# Patient Record
Sex: Female | Born: 1935 | Race: White | Hispanic: No
Health system: Southern US, Community
[De-identification: ages and names within clinical notes are randomized; demographics above are authoritative.]

## PROBLEM LIST (undated history)

## (undated) DIAGNOSIS — F0390 Unspecified dementia without behavioral disturbance: Secondary | ICD-10-CM

## (undated) DIAGNOSIS — I639 Cerebral infarction, unspecified: Secondary | ICD-10-CM

## (undated) HISTORY — PX: TONSILLECTOMY: SUR1361

## (undated) HISTORY — PX: BACK SURGERY: SHX140

## (undated) HISTORY — PX: ABDOMINAL HYSTERECTOMY: SHX81

---

## 2009-07-28 ENCOUNTER — Emergency Department (HOSPITAL_BASED_OUTPATIENT_CLINIC_OR_DEPARTMENT_OTHER): Admission: EM | Admit: 2009-07-28 | Discharge: 2009-07-28 | Payer: Self-pay | Admitting: Emergency Medicine

## 2009-07-28 ENCOUNTER — Ambulatory Visit: Payer: Self-pay | Admitting: Diagnostic Radiology

## 2010-08-07 ENCOUNTER — Emergency Department (HOSPITAL_BASED_OUTPATIENT_CLINIC_OR_DEPARTMENT_OTHER): Admission: EM | Admit: 2010-08-07 | Discharge: 2010-08-07 | Payer: Self-pay | Admitting: Emergency Medicine

## 2010-11-02 IMAGING — CT CT PELVIS W/ CM
2 of 4 series · 16 of 46 positions shown, 18 images · IV contrast (APPLIED)
Comparison: None

CT HEAD

CLINICAL DATA: Fall with head injury and headache, left abdominal
pelvic pain.

CT HEAD WITHOUT CONTRAST
CT ABDOMEN AND PELVIS WITH CONTRAST
TECHNIQUE: Multidetector CT imaging of the head was performed
following the standard protocol without intravenous contrast.
Multidetector CT imaging of the abdomen and pelvis was performed
following the standard protocol during bolus administration of
intravenous contrast.
Contrast: 100 ml intravenous Emnipaque-PVV

[Series 2: abd/pelvis 5.0 b31f · axial · 0.65mm/px · z∈[+823,+1168]mm · 13 of 77 slices shown, 15 images]
[im 4/77  soft-tissue]
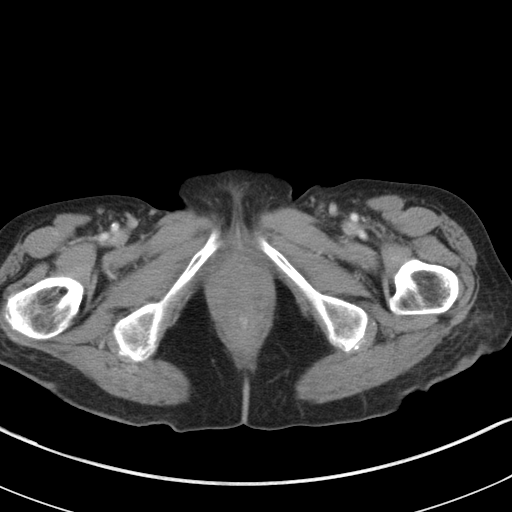
[im 4/77  bone]
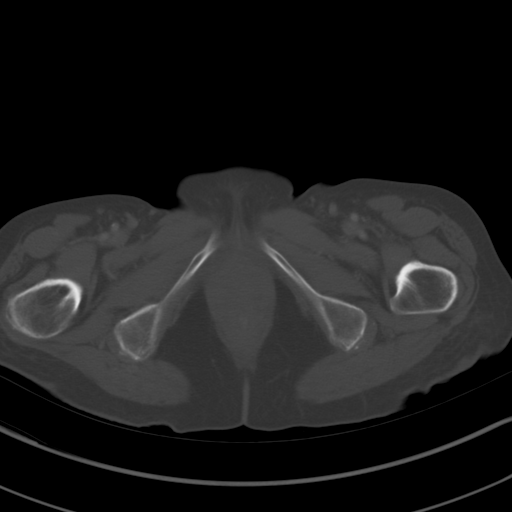
[im 10/77  soft-tissue]
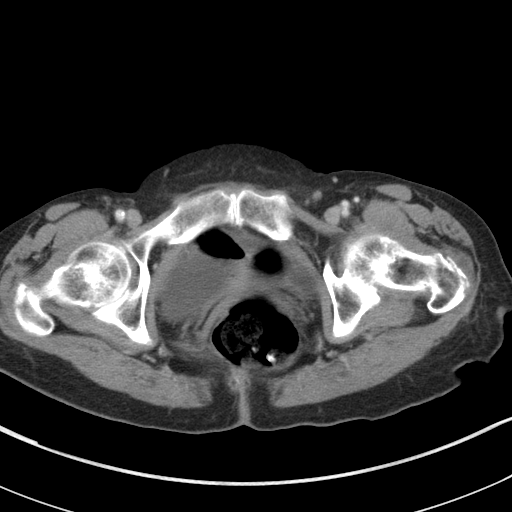
[im 17/77  soft-tissue]
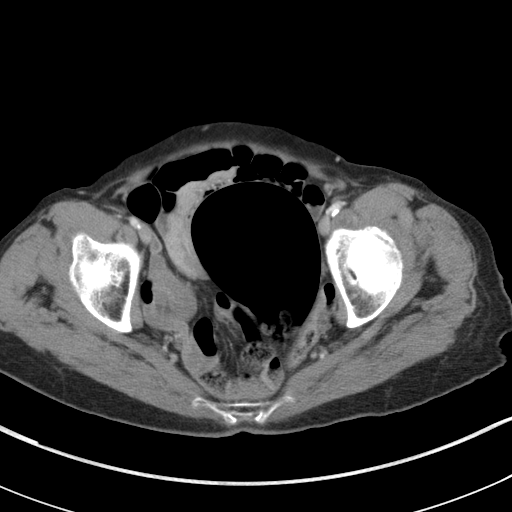
[im 20/77  soft-tissue]
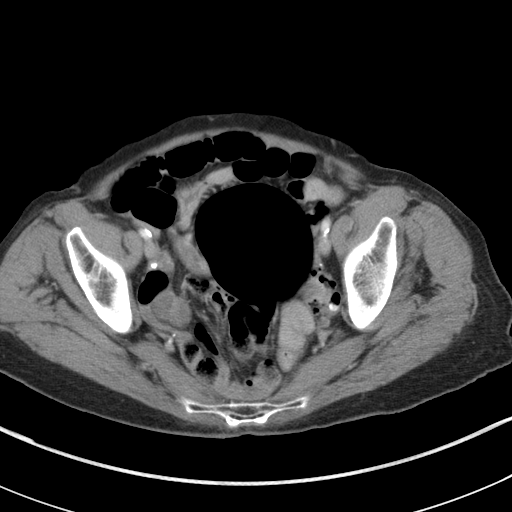
[im 27/77  soft-tissue]
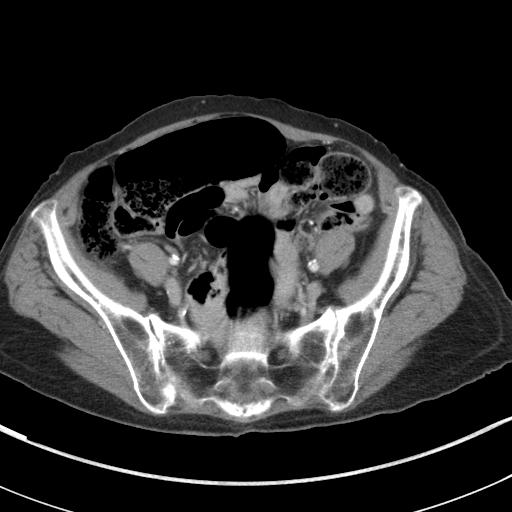
[im 34/77  soft-tissue]
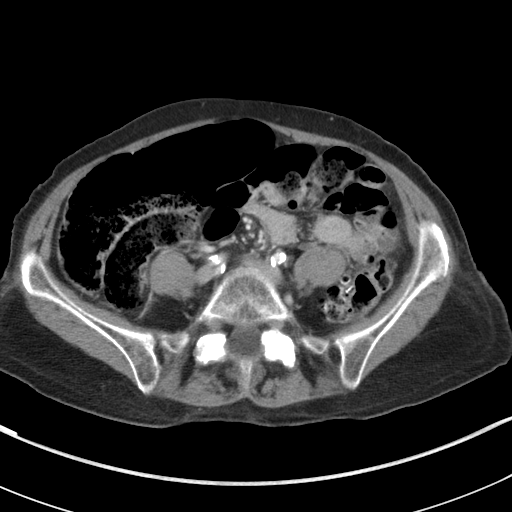
[im 40/77  soft-tissue]
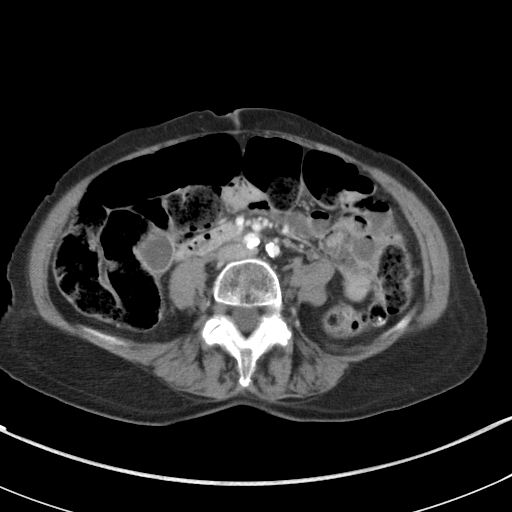
[im 43/77  soft-tissue]
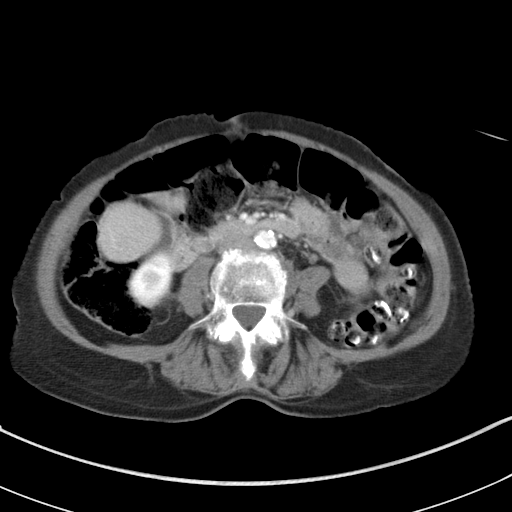
[im 50/77  soft-tissue]
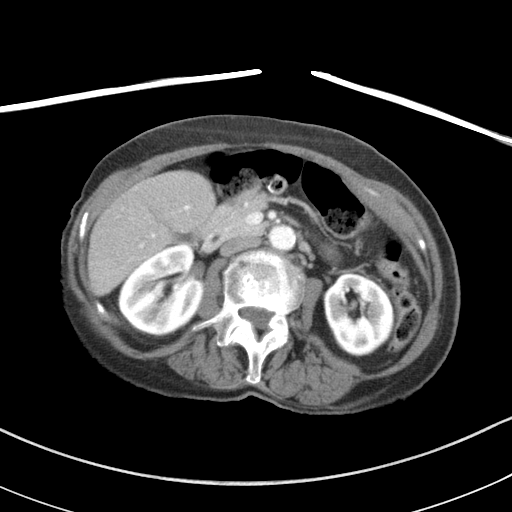
[im 50/77  bone]
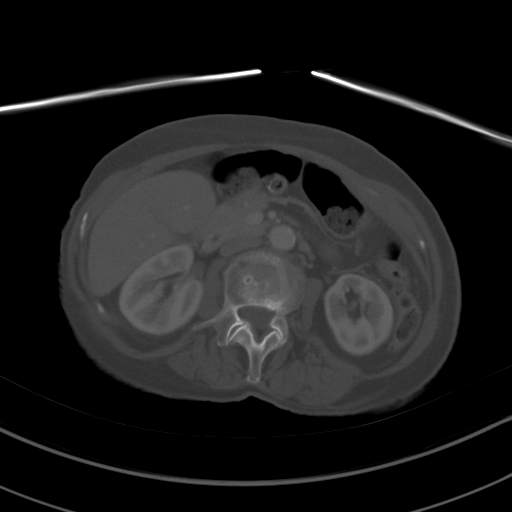
[im 57/77  soft-tissue]
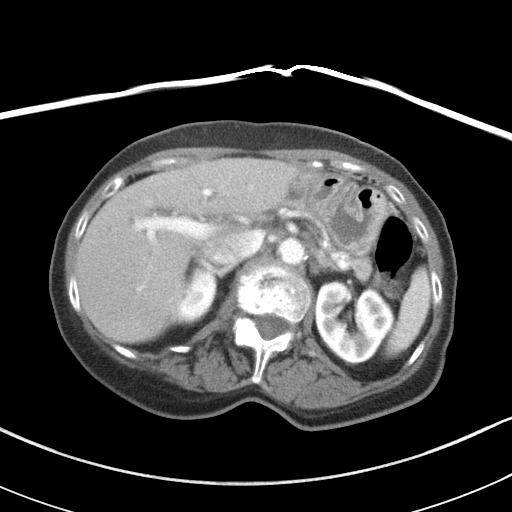
[im 60/77  soft-tissue]
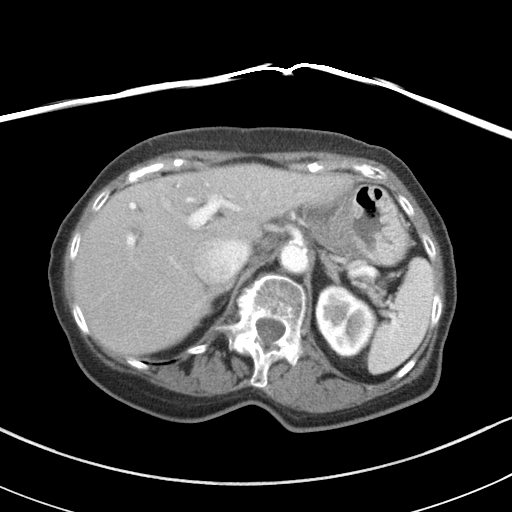
[im 67/77  soft-tissue]
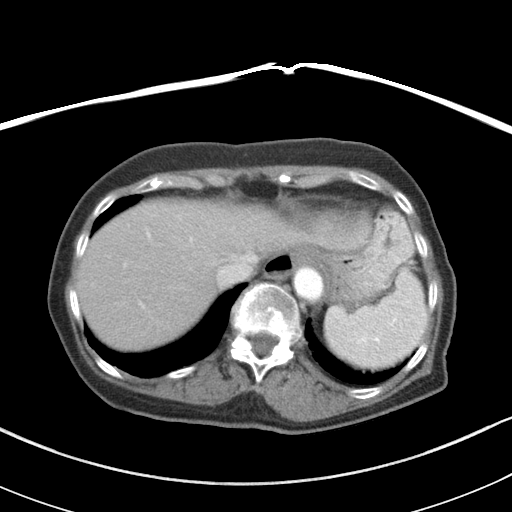
[im 73/77  soft-tissue]
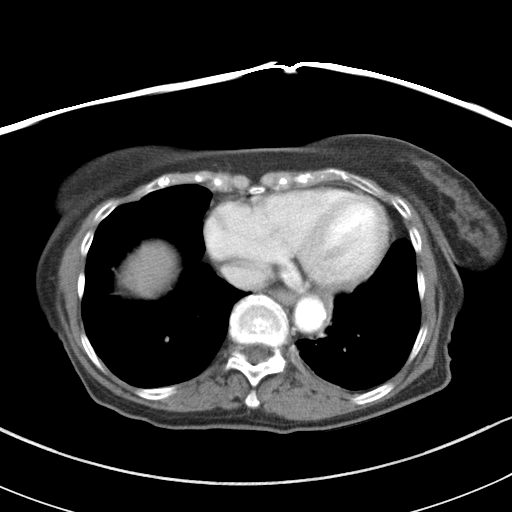

[Series 5: abd/pelvis 3.0 coronal · coronal · 0.60mm/px · 3 of 75 slices shown]
[im 25/75  soft-tissue]
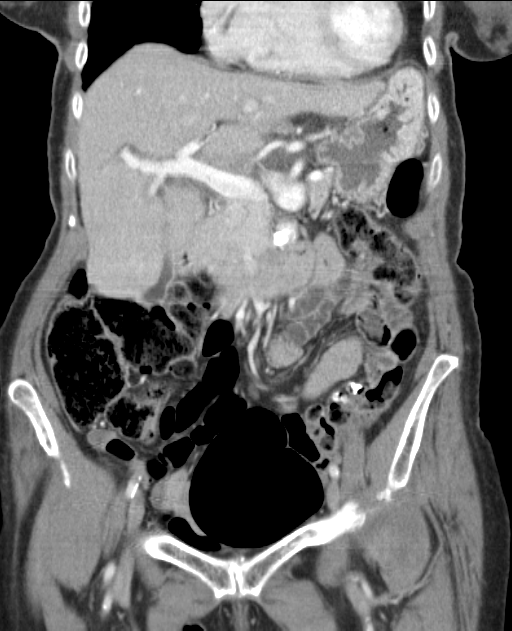
[im 33/75  soft-tissue]
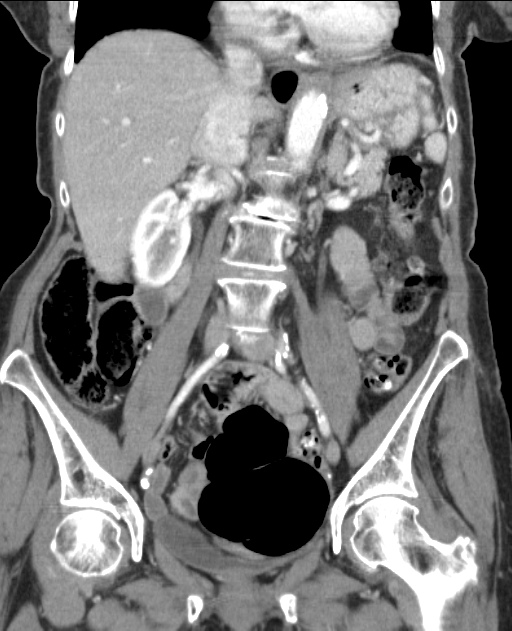
[im 42/75  soft-tissue]
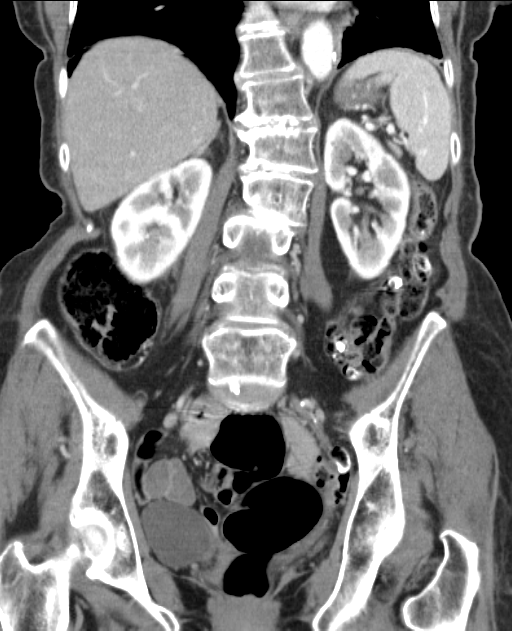

[16 of 46 positions shown; findings below may reference images not displayed]

FINDINGS: A remote right MCA infarct is noted.

No acute intracranial abnormalities are identified, including mass
lesion or mass effect, hydrocephalus, extra-axial fluid collection,
midline shift, hemorrhage, or acute infarction.  Please note that
acute infarction may be occult on CT for 24-48 hours.

The visualized bony calvarium is unremarkable.
IMPRESSION: No evidence of acute intracranial abnormality.

Remote right MCA infarct.

CT ABDOMEN
FINDINGS: The liver, gallbladder, spleen, adrenal glands,
pancreas, and kidneys are unremarkable except for bilateral renal
cortical thinning.
No free fluid, enlarged lymph nodes, biliary dilation or abdominal
aortic aneurysm identified.
Moderate colonic stool and gas is identified.
The bowel is otherwise unremarkable.
No acute or suspicious bony abnormalities are identified.
Degenerative changes within the lumbar spine noted.
IMPRESSION: No evidence of acute abnormality within the abdomen.

Moderate colonic stool and gas.

CT PELVIS
FINDINGS: A moderate amount of colonic stool and gas is noted.
No dilated small bowel loops are present.

No evidence of free fluid or enlarged lymph nodes noted.
The bladder is within normal limits.
No acute or suspicious bony abnormalities are identified.
IMPRESSION: No evidence of acute abnormality.

Moderate colonic stool and gas.

## 2011-01-26 LAB — URINALYSIS, ROUTINE W REFLEX MICROSCOPIC
Glucose, UA: >1000 mg/dL — AB
Ketones, ur: NEGATIVE mg/dL
Nitrite: NEGATIVE
Urobilinogen, UA: 1 mg/dL (ref 0.0–1.0)

## 2011-01-26 LAB — PROTIME-INR
INR: 1.39 (ref 0.00–1.49)
Prothrombin Time: 16.9 seconds — ABNORMAL HIGH (ref 11.6–15.2)

## 2011-01-26 LAB — BASIC METABOLIC PANEL
CO2: 31 mEq/L (ref 19–32)
Calcium: 9.8 mg/dL (ref 8.4–10.5)
GFR calc non Af Amer: >60 mL/min (ref >60)
Glucose, Bld: 192 mg/dL — ABNORMAL HIGH (ref 70–99)
Sodium: 139 mEq/L (ref 135–145)

## 2011-01-26 LAB — CBC
HCT: 39.1 % (ref 36.0–46.0)
MCHC: 33.5 g/dL (ref 30.0–36.0)
Platelets: 204 10*3/uL (ref 150–400)
RBC: 4.62 MIL/uL (ref 3.87–5.11)
RDW: 14.5 % (ref 11.5–15.5)
WBC: 7.8 10*3/uL (ref 4.0–10.5)

## 2011-01-26 LAB — URINE MICROSCOPIC-ADD ON

## 2011-01-26 LAB — DIFFERENTIAL
Lymphocytes Relative: 15 % (ref 12–46)
Monocytes Relative: 7 % (ref 3–12)
Neutrophils Relative %: 77 % (ref 43–77)

## 2011-01-26 LAB — APTT: aPTT: 27 seconds (ref 24–37)

## 2011-07-12 ENCOUNTER — Encounter: Payer: Self-pay | Admitting: *Deleted

## 2011-07-12 ENCOUNTER — Emergency Department (INDEPENDENT_AMBULATORY_CARE_PROVIDER_SITE_OTHER): Payer: No Typology Code available for payment source

## 2011-07-12 ENCOUNTER — Emergency Department (HOSPITAL_BASED_OUTPATIENT_CLINIC_OR_DEPARTMENT_OTHER)
Admission: EM | Admit: 2011-07-12 | Discharge: 2011-07-12 | Disposition: A | Discharge status: Home / Self Care | Payer: No Typology Code available for payment source | Attending: Emergency Medicine | Admitting: Emergency Medicine

## 2011-07-12 DIAGNOSIS — G319 Degenerative disease of nervous system, unspecified: Secondary | ICD-10-CM

## 2011-07-12 DIAGNOSIS — Y92009 Unspecified place in unspecified non-institutional (private) residence as the place of occurrence of the external cause: Secondary | ICD-10-CM | POA: Insufficient documentation

## 2011-07-12 DIAGNOSIS — W19XXXA Unspecified fall, initial encounter: Secondary | ICD-10-CM | POA: Insufficient documentation

## 2011-07-12 DIAGNOSIS — IMO0002 Reserved for concepts with insufficient information to code with codable children: Secondary | ICD-10-CM

## 2011-07-12 DIAGNOSIS — R220 Localized swelling, mass and lump, head: Secondary | ICD-10-CM

## 2011-07-12 DIAGNOSIS — S62309A Unspecified fracture of unspecified metacarpal bone, initial encounter for closed fracture: Secondary | ICD-10-CM | POA: Insufficient documentation

## 2011-07-12 DIAGNOSIS — S52609A Unspecified fracture of lower end of unspecified ulna, initial encounter for closed fracture: Secondary | ICD-10-CM | POA: Insufficient documentation

## 2011-07-12 DIAGNOSIS — S0083XA Contusion of other part of head, initial encounter: Secondary | ICD-10-CM

## 2011-07-12 DIAGNOSIS — M25539 Pain in unspecified wrist: Secondary | ICD-10-CM

## 2011-07-12 DIAGNOSIS — S0003XA Contusion of scalp, initial encounter: Secondary | ICD-10-CM | POA: Insufficient documentation

## 2011-07-12 DIAGNOSIS — E119 Type 2 diabetes mellitus without complications: Secondary | ICD-10-CM | POA: Insufficient documentation

## 2011-07-12 DIAGNOSIS — Z8679 Personal history of other diseases of the circulatory system: Secondary | ICD-10-CM | POA: Insufficient documentation

## 2011-07-12 DIAGNOSIS — X58XXXA Exposure to other specified factors, initial encounter: Secondary | ICD-10-CM

## 2011-07-12 DIAGNOSIS — Z79899 Other long term (current) drug therapy: Secondary | ICD-10-CM | POA: Insufficient documentation

## 2011-07-12 DIAGNOSIS — S52509A Unspecified fracture of the lower end of unspecified radius, initial encounter for closed fracture: Secondary | ICD-10-CM | POA: Insufficient documentation

## 2011-07-12 DIAGNOSIS — S62329A Displaced fracture of shaft of unspecified metacarpal bone, initial encounter for closed fracture: Secondary | ICD-10-CM

## 2011-07-12 DIAGNOSIS — F0390 Unspecified dementia without behavioral disturbance: Secondary | ICD-10-CM | POA: Insufficient documentation

## 2011-07-12 DIAGNOSIS — Z7901 Long term (current) use of anticoagulants: Secondary | ICD-10-CM

## 2011-07-12 DIAGNOSIS — S5290XA Unspecified fracture of unspecified forearm, initial encounter for closed fracture: Secondary | ICD-10-CM

## 2011-07-12 HISTORY — DX: Unspecified dementia, unspecified severity, without behavioral disturbance, psychotic disturbance, mood disturbance, and anxiety: F03.90

## 2011-07-12 HISTORY — DX: Cerebral infarction, unspecified: I63.9

## 2011-07-12 NOTE — ED Provider Notes (Signed)
History     CSN: [id number] Arrival date & time: 07/12/2011  6:01 PM   Chief Complaint  Patient presents with  . Fall  . Head Injury     (Include location/radiation/quality/duration/timing/severity/associated sxs/prior treatment) HPI Pt reports she fell while walking in the kitchen earlier today. Unsure how she fell, but she did not pass out. She hit her head but did not have loss of consciousness. She is complaining of L forehead pain and L wrist pain. Pain in wrist is moderate, aching and worse with movement.   Past Medical History  Diagnosis Date  . CVA (cerebral infarction)   . Diabetes mellitus   . Dementia      Past Surgical History  Procedure Date  . Abdominal hysterectomy   . Tonsillectomy   . Back surgery     History reviewed. No pertinent family history.  History  Substance Use Topics  . Smoking status: Not on file  . Smokeless tobacco: Not on file  . Alcohol Use: No    OB History    Grav Para Term Preterm Abortions TAB SAB Ect Mult Living                  Review of Systems All other systems reviewed and are negative except as noted in HPI.   Allergies  Penicillins  Home Medications   Current Outpatient Rx  Name Route Sig Dispense Refill  . ACETAMINOPHEN 500 MG PO TABS Oral Take 1,000 mg by mouth every 6 (six) hours as needed. pain     . CARBIDOPA-LEVODOPA 25-100 MG PO TABS Oral Take 0.5 tablets by mouth 3 (three) times daily.      Marland Kitchen DOCUSATE SODIUM 100 MG PO CAPS Oral Take 100 mg by mouth 2 (two) times daily.      Marland Kitchen GALANTAMINE HYDROBROMIDE 24 MG PO CP24 Oral Take 24 mg by mouth daily with breakfast.      . LAMOTRIGINE 100 MG PO TB24 Oral Take 1 tablet by mouth daily.      Marland Kitchen LEVOTHYROXINE SODIUM 88 MCG PO TABS Oral Take 88 mcg by mouth daily.      Marland Kitchen MEMANTINE HCL 10 MG PO TABS Oral Take 20 mg by mouth at bedtime.      Marland Kitchen OLANZAPINE-FLUOXETINE HCL 3-25 MG PO CAPS Oral Take 1 capsule by mouth every evening.      Marland Kitchen RISEDRONATE SODIUM 35 MG PO  TABS Oral Take 35 mg by mouth every 7 (seven) days. with water on empty stomach, nothing by mouth or lie down for next 30 minutes. Give on Sunday     . SIMVASTATIN 40 MG PO TABS Oral Take 40 mg by mouth at bedtime.      . WARFARIN SODIUM 1 MG PO TABS Oral Take 1 mg by mouth daily. Take with 5mg  tab to equal 6mg  dose     . WARFARIN SODIUM 5 MG PO TABS Oral Take 5 mg by mouth daily. Take with the 1mg  tab to equal a 6mg  dose every day       Physical Exam    BP 137/52  Pulse 68  Temp(Src) 97.5 F (36.4 C) (Oral)  Resp 16  Ht 5\' 2"  (1.575 m)  Wt 114 lb (51.71 kg)  BMI 20.85 kg/m2  SpO2 92%  Physical Exam  Nursing note and vitals reviewed. Constitutional: She is oriented to person, place, and time. She appears well-developed and well-nourished.  HENT:  Head: Normocephalic.       Large L forehead  hematoma; small forehead sebaceous cyst is chronic per family  Eyes: EOM are normal. Pupils are equal, round, and reactive to light.  Neck: Normal range of motion. Neck supple.       Non tender C-spine   Cardiovascular: Normal rate, normal heart sounds and intact distal pulses.   Pulmonary/Chest: Effort normal and breath sounds normal.  Abdominal: Bowel sounds are normal. She exhibits no distension. There is no tenderness.  Musculoskeletal: She exhibits tenderness. She exhibits no edema.       Swelling and tenderness with ROM of the L wrist. Abrasions to fingers; No deformity and neurovascularly intact on L wrist   Neurological: She is alert and oriented to person, place, and time. She has normal strength. No cranial nerve deficit or sensory deficit.  Skin: Skin is warm and dry. No rash noted.  Psychiatric: She has a normal mood and affect.    ED Course  Procedures   Dg Wrist Complete Left  07/12/2011  *RADIOLOGY REPORT*  Clinical Data: Fall today.  Pain at left fourth and fifth metacarpals left wrist joint.  On Coumadin.  LEFT WRIST - COMPLETE 3+ VIEW  Comparison: Hand films same date.   Findings: Impacted distal radius fracture with intra-articular extension. Ulnar styloid fracture.  Moderate osteopenia.  Fractures of the fifth metacarpal and proximal third phalanx were detailed on dedicated hand films.  IMPRESSION: Distal radius and ulnar fractures, with intra-articular extension of the radius fracture.  Per CMS PQRS reporting requirements (PQRS Measure 24): Given the patient's age of greater than 50 and the fracture site (hip, distal radius, or spine), the patient should be tested for osteoporosis using DXA, and the appropriate treatment considered based on the DXA results.  Original Report Authenticated By: Consuello Bossier, M.D.   Ct Head Wo Contrast  07/12/2011  *RADIOLOGY REPORT*  Clinical Data: Fall.  Bruising to the left side of the head. History of stroke.  CT HEAD WITHOUT CONTRAST  Technique:  Contiguous axial images were obtained from the base of the skull through the vertex without contrast.  Comparison: CT head without contrast 07/28/2009  Findings: A remote right MCA territory infarct is stable.  There is no significant extension of the infarct.  No hemorrhage or mass lesion is present.  Atrophy is stable.  The paranasal sinuses and mastoid air cells are clear.  The osseous skull is intact.  A left supraorbital hematoma is present without underlying fracture. A subcutaneous hyperdense nodule is present just medial to the hematoma, measuring 8.5 mm.  This has increased in size since the previous study.  IMPRESSION:  1.  New left supraorbital scalp hematoma without underlying fracture. 2.  Enlarging subcutaneous nodule in the left supraorbital scalp. This is just medial to the hemorrhage.  This raises concern for a skin cancer.  Recommend direct visualization.  It could be a subcutaneous lesion such as a sebaceous cyst. 3.  Stable right MCA territory infarct. 4.  Stable atrophy. 4.  No acute intracranial abnormality.  Original Report Authenticated By: Jamesetta Orleans. MATTERN, M.D.    Dg Hand Complete Left  07/12/2011  *RADIOLOGY REPORT*  Clinical Data: Fall today with pain left fourth and fifth metacarpals and left wrist.  LEFT HAND - COMPLETE 3+ VIEW  Comparison: Wrist films same date  Findings: Moderate osteopenia.  A spiral fracture of the fifth metacarpal shaft is extra-articular. This could be subacute.  Chip fracture of the base of the third proximal phalanx is intra- articular.  Mildly displaced.  Wrist fractures  will be detailed on dedicated films.  IMPRESSION: Fractures of the proximal third phalanx and fifth metacarpal. Question subacute fracture of the fifth metacarpal.  Underlying osteopenia.  Wrist fractures, to be detailed on separate dedicated films.  Original Report Authenticated By: Consuello Bossier, M.D.     MDM CT neg for intracranial injury, hyperdense nodule seen on CT corresponds to sebaceous cyst. Reviewed xray images and discussed result with patient and family. Will place in wrist splint and followup with Hand for definitive care. Pt denies need for any pain medications.       Charles B. Bernette Mayers, MD 07/12/11 [phone]

## 2011-07-12 NOTE — ED Notes (Signed)
Pt c/o fall this am from standing landing on tile floor, injury to head , and laceration to left hand, pain to left wrist

## 2011-07-28 ENCOUNTER — Encounter (HOSPITAL_BASED_OUTPATIENT_CLINIC_OR_DEPARTMENT_OTHER): Payer: Self-pay | Admitting: *Deleted

## 2011-07-28 ENCOUNTER — Emergency Department (INDEPENDENT_AMBULATORY_CARE_PROVIDER_SITE_OTHER): Payer: No Typology Code available for payment source

## 2011-07-28 ENCOUNTER — Other Ambulatory Visit: Payer: Self-pay

## 2011-07-28 ENCOUNTER — Emergency Department (HOSPITAL_BASED_OUTPATIENT_CLINIC_OR_DEPARTMENT_OTHER)
Admission: EM | Admit: 2011-07-28 | Discharge: 2011-07-28 | Disposition: A | Discharge status: Home / Self Care | Payer: No Typology Code available for payment source | Source: Home / Self Care | Attending: Emergency Medicine | Admitting: Emergency Medicine

## 2011-07-28 ENCOUNTER — Inpatient Hospital Stay (HOSPITAL_COMMUNITY)
Admission: EM | Admit: 2011-07-28 | Discharge: 2011-07-31 | DRG: 683 | Disposition: A | Discharge status: Skilled Nursing Facility | Payer: No Typology Code available for payment source | Source: Other Acute Inpatient Hospital | Attending: Internal Medicine | Admitting: Internal Medicine

## 2011-07-28 DIAGNOSIS — F0390 Unspecified dementia without behavioral disturbance: Secondary | ICD-10-CM | POA: Insufficient documentation

## 2011-07-28 DIAGNOSIS — G319 Degenerative disease of nervous system, unspecified: Secondary | ICD-10-CM | POA: Insufficient documentation

## 2011-07-28 DIAGNOSIS — I69998 Other sequelae following unspecified cerebrovascular disease: Secondary | ICD-10-CM

## 2011-07-28 DIAGNOSIS — R5383 Other fatigue: Secondary | ICD-10-CM | POA: Insufficient documentation

## 2011-07-28 DIAGNOSIS — W19XXXA Unspecified fall, initial encounter: Secondary | ICD-10-CM

## 2011-07-28 DIAGNOSIS — F05 Delirium due to known physiological condition: Secondary | ICD-10-CM | POA: Insufficient documentation

## 2011-07-28 DIAGNOSIS — R5381 Other malaise: Secondary | ICD-10-CM | POA: Diagnosis present

## 2011-07-28 DIAGNOSIS — R4789 Other speech disturbances: Secondary | ICD-10-CM | POA: Diagnosis present

## 2011-07-28 DIAGNOSIS — Z79899 Other long term (current) drug therapy: Secondary | ICD-10-CM | POA: Insufficient documentation

## 2011-07-28 DIAGNOSIS — Z87891 Personal history of nicotine dependence: Secondary | ICD-10-CM

## 2011-07-28 DIAGNOSIS — Z8679 Personal history of other diseases of the circulatory system: Secondary | ICD-10-CM | POA: Insufficient documentation

## 2011-07-28 DIAGNOSIS — R4701 Aphasia: Secondary | ICD-10-CM

## 2011-07-28 DIAGNOSIS — I517 Cardiomegaly: Secondary | ICD-10-CM

## 2011-07-28 DIAGNOSIS — E039 Hypothyroidism, unspecified: Secondary | ICD-10-CM | POA: Diagnosis present

## 2011-07-28 DIAGNOSIS — Y998 Other external cause status: Secondary | ICD-10-CM

## 2011-07-28 DIAGNOSIS — S0003XA Contusion of scalp, initial encounter: Secondary | ICD-10-CM | POA: Diagnosis present

## 2011-07-28 DIAGNOSIS — R269 Unspecified abnormalities of gait and mobility: Secondary | ICD-10-CM | POA: Insufficient documentation

## 2011-07-28 DIAGNOSIS — Z9181 History of falling: Secondary | ICD-10-CM

## 2011-07-28 DIAGNOSIS — N179 Acute kidney failure, unspecified: Principal | ICD-10-CM | POA: Diagnosis present

## 2011-07-28 DIAGNOSIS — R627 Adult failure to thrive: Secondary | ICD-10-CM | POA: Diagnosis present

## 2011-07-28 DIAGNOSIS — R296 Repeated falls: Secondary | ICD-10-CM | POA: Diagnosis present

## 2011-07-28 DIAGNOSIS — E119 Type 2 diabetes mellitus without complications: Secondary | ICD-10-CM | POA: Insufficient documentation

## 2011-07-28 DIAGNOSIS — E873 Alkalosis: Secondary | ICD-10-CM | POA: Diagnosis present

## 2011-07-28 DIAGNOSIS — T07XXXA Unspecified multiple injuries, initial encounter: Secondary | ICD-10-CM

## 2011-07-28 DIAGNOSIS — S1093XA Contusion of unspecified part of neck, initial encounter: Secondary | ICD-10-CM | POA: Diagnosis present

## 2011-07-28 DIAGNOSIS — N189 Chronic kidney disease, unspecified: Secondary | ICD-10-CM | POA: Diagnosis present

## 2011-07-28 DIAGNOSIS — R531 Weakness: Secondary | ICD-10-CM

## 2011-07-28 DIAGNOSIS — I129 Hypertensive chronic kidney disease with stage 1 through stage 4 chronic kidney disease, or unspecified chronic kidney disease: Secondary | ICD-10-CM | POA: Diagnosis present

## 2011-07-28 LAB — BASIC METABOLIC PANEL
BUN: 28 mg/dL — ABNORMAL HIGH (ref 6–23)
CO2: 34 mEq/L — ABNORMAL HIGH (ref 19–32)
Calcium: 9.3 mg/dL (ref 8.4–10.5)
Chloride: 99 mEq/L (ref 96–112)
Creatinine, Ser: 1.4 mg/dL — ABNORMAL HIGH (ref 0.50–1.10)
GFR calc Af Amer: 38 mL/min — ABNORMAL LOW (ref >90)
GFR calc non Af Amer: 33 mL/min — ABNORMAL LOW (ref >90)
Glucose, Bld: 162 mg/dL — ABNORMAL HIGH (ref 70–99)
Potassium: 3.4 mEq/L — ABNORMAL LOW (ref 3.5–5.1)
Sodium: 142 mEq/L (ref 135–145)

## 2011-07-28 LAB — CBC
HCT: 38.2 % (ref 36.0–46.0)
Hemoglobin: 12.2 g/dL (ref 12.0–15.0)
MCH: 27.9 pg (ref 26.0–34.0)
MCHC: 31.9 g/dL (ref 30.0–36.0)
MCV: 87.4 fL (ref 78.0–100.0)
Platelets: 255 10*3/uL (ref 150–400)
RBC: 4.37 MIL/uL (ref 3.87–5.11)
RDW: 13.9 % (ref 11.5–15.5)
WBC: 7.1 10*3/uL (ref 4.0–10.5)

## 2011-07-28 LAB — URINALYSIS, ROUTINE W REFLEX MICROSCOPIC
Glucose, UA: NEGATIVE mg/dL
Hgb urine dipstick: NEGATIVE
Ketones, ur: 15 mg/dL — AB
Leukocytes, UA: NEGATIVE
Nitrite: NEGATIVE
Protein, ur: 30 mg/dL — AB
Specific Gravity, Urine: 1.028 (ref 1.005–1.030)
Urobilinogen, UA: 1 mg/dL (ref 0.0–1.0)
pH: 5 (ref 5.0–8.0)

## 2011-07-28 LAB — URINE MICROSCOPIC-ADD ON

## 2011-07-28 LAB — PROTIME-INR
INR: 2.41 — ABNORMAL HIGH (ref 0.00–1.49)
Prothrombin Time: 26.6 seconds — ABNORMAL HIGH (ref 11.6–15.2)

## 2011-07-28 LAB — TROPONIN I: Troponin I: <0.3 ng/mL (ref <0.30)

## 2011-07-28 NOTE — ED Notes (Signed)
Daughter and granddaughter at the bedside. Pt was brought to ED with increased garbled speech different from her baseline. They state for the past 2 days at night she has been talking all night and has had increased confusion. States pt fell 2 weeks ago an was seen here. Hematoma to her left forehead. Left sided mouth droop. This am was having difficulty walking with her walker and throughout the day it progressed to not being able to walk.

## 2011-07-28 NOTE — ED Provider Notes (Addendum)
History   88yf with altered mental status. Increased generalized weakness over past couple weeks. Also intermittent bizarre behavior such as reaching for things that arent there. Pt with multiple recent falls. No fever. Decreased apetite. Speech seems less understandable. No new med changes that thye are aware of. No vomiting or diarrhea. Increasingly more and more difficult for family to take care of at home.  CSN: [id number] Arrival date & time: 07/28/2011  5:23 PM  Chief Complaint  Patient presents with  . Aphasia    (Consider location/radiation/quality/duration/timing/severity/associated sxs/prior treatment) HPI  Past Medical History  Diagnosis Date  . CVA (cerebral infarction)   . Diabetes mellitus   . Dementia     Past Surgical History  Procedure Date  . Abdominal hysterectomy   . Tonsillectomy   . Back surgery     History reviewed. No pertinent family history.  History  Substance Use Topics  . Smoking status: Not on file  . Smokeless tobacco: Not on file  . Alcohol Use: No    OB History    Grav Para Term Preterm Abortions TAB SAB Ect Mult Living                  Review of Systems  Unable to perform ROS   ROS is limited because of patient's dementia.  Allergies  Penicillins  Home Medications   Current Outpatient Rx  Name Route Sig Dispense Refill  . ACETAMINOPHEN 500 MG PO TABS Oral Take 1,000 mg by mouth every 6 (six) hours as needed. For pain    . CARBIDOPA-LEVODOPA 25-100 MG PO TABS Oral Take 0.5 tablets by mouth 3 (three) times daily.      Marland Kitchen DOCUSATE SODIUM 100 MG PO CAPS Oral Take 100 mg by mouth 2 (two) times daily.      Marland Kitchen GALANTAMINE HYDROBROMIDE 24 MG PO CP24 Oral Take 24 mg by mouth daily with breakfast.      . LAMOTRIGINE 100 MG PO TB24 Oral Take 1 tablet by mouth daily.      Marland Kitchen LEVOTHYROXINE SODIUM 88 MCG PO TABS Oral Take 88 mcg by mouth daily.      Marland Kitchen MEMANTINE HCL 10 MG PO TABS Oral Take 20 mg by mouth at bedtime.      Marland Kitchen  OLANZAPINE-FLUOXETINE HCL 3-25 MG PO CAPS Oral Take 1 capsule by mouth every evening.      Marland Kitchen RISEDRONATE SODIUM 35 MG PO TABS Oral Take 35 mg by mouth every 7 (seven) days. with water on empty stomach, nothing by mouth or lie down for next 30 minutes. Give on Sunday     . SIMVASTATIN 40 MG PO TABS Oral Take 40 mg by mouth at bedtime.      . WARFARIN SODIUM 5 MG PO TABS Oral Take 5 mg by mouth daily.     . WARFARIN SODIUM 1 MG PO TABS Oral Take 1 mg by mouth daily. Take with 5mg  tab to equal 6mg  dose       BP 109/53  Pulse 90  Temp(Src) 98.2 F (36.8 C) (Oral)  Resp 16  SpO2 90%  Physical Exam  HENT:       Hematoma L forehead. Ecchymosis of what appears to be varying ages. EOM appear intact but. Pupils reactive.   Eyes: Conjunctivae are normal. Pupils are equal, round, and reactive to light.  Neck: Neck supple.  Cardiovascular: Normal rate, regular rhythm and normal heart sounds.   Pulmonary/Chest: Effort normal and breath sounds normal. No stridor. No  respiratory distress. She has no wheezes.  Abdominal: Soft. She exhibits no distension.  Neurological:       Difficult to adequately asses because pt not reliably following commands. Appears to be moving all extremities equally. No obvious facial droop. EOM appear intact.  Skin: Skin is warm and dry.    ED Course  Procedures (including critical care time)  Labs Reviewed  BASIC METABOLIC PANEL - Abnormal; Notable for the following:    Potassium 3.4 (*)    CO2 34 (*)    Glucose, Bld 162 (*)    BUN 28 (*)    Creatinine, Ser 1.40 (*)    GFR calc non Af Amer 33 (*)    GFR calc Af Amer 38 (*)    All other components within normal limits  URINALYSIS, ROUTINE W REFLEX MICROSCOPIC - Abnormal; Notable for the following:    Color, Urine AMBER (*) BIOCHEMICALS MAY BE AFFECTED BY COLOR   Appearance CLOUDY (*)    Bilirubin Urine MODERATE (*)    Ketones, ur 15 (*)    Protein, ur 30 (*)    All other components within normal limits    PROTIME-INR - Abnormal; Notable for the following:    Prothrombin Time 26.6 (*)    INR 2.41 (*)    All other components within normal limits  URINE MICROSCOPIC-ADD ON - Abnormal; Notable for the following:    Squamous Epithelial / LPF FEW (*)    Casts HYALINE CASTS (*)    All other components within normal limits  TROPONIN I  CBC   Dg Chest 2 View  07/28/2011  *RADIOLOGY REPORT*  Clinical Data: Fall.  Unsteady gait.  CHEST - 2 VIEW  Comparison: 07/28/2009  Findings: Mild cardiomegaly.  Probable scarring in the lingula. Nodular density again seen in the right lung laterally is unchanged, most compatible with scarring.  No effusions.  No acute bony abnormality.  IMPRESSION: Cardiomegaly, bibasilar scarring.  Original Report Authenticated By: Cyndie Chime, M.D.   Ct Head Wo Contrast  07/28/2011  *RADIOLOGY REPORT*  Clinical Data: Aphasia.  Slurred speech.  Unsteady gait for 3 days. Fall/07/12/2011.  CT HEAD WITHOUT CONTRAST  Technique:  Contiguous axial images were obtained from the base of the skull through the vertex without contrast.  Comparison: 07/12/2011  Findings: Right MCA division encephalomalacia is present. Intracranial atherosclerosis.  There is no hyperdense MCA sign or evidence of acute infarction.  No mass lesion, mass effect, midline shift, or hydrocephalus.  High density soft tissue mass is present measuring 20 mm in the left frontal scalp, likely hematoma from recent fall.  Smaller subcutaneous lesion is present over the left forehead measuring 7 mm.  Atrophy and chronic ischemic white matter disease is present.  Paranasal sinuses appear within normal limits. Mastoid air cells clear.  Extraocular muscle calcification.  No acute infarct.  IMPRESSION:  1.  No acute intracranial abnormality.  Atrophy, chronic ischemic white matter disease and right MCA division encephalomalacia. 2.  Evolving left forehead/scalp hematoma. 3.  Left forehead/scalp lesion again noted.  Original Report  Authenticated By: Andreas Newport, M.D.   EKG:  Rhythm: normal sinus Rate: 75 Axis: left Intervals:normal ST segments: NS ST changes  1. Weakness generalized   2. Delirium due to known physiological condition   3. Multiple contusions       MDM  88yF with dysarthria, generalized weakness and altered mental status. W/u relatively unremarkable. Head CT with no acute findings aside from extrancranial soft tissue findings as noted  on previous. May have component of post concussive syndrome. Also sounds like pt's sleep/wake cycle is somewhat off. Consider delerium with picking at invisible. Initial w/u does not suggest infection. Family feels unsafe with pt at home. Will discuss with medicine. Family considering placement.        Raeford Razor, MD 08/04/11 [id number]  Raeford Razor, MD 08/08/11 1044

## 2011-07-28 NOTE — ED Notes (Signed)
Brought in by family, c/o slurred speech and unsteady gait x 3 days. HX cva and left side weakness  And facial drop.

## 2011-07-29 LAB — BASIC METABOLIC PANEL
BUN: 25 mg/dL — ABNORMAL HIGH (ref 6–23)
Chloride: 104 mEq/L (ref 96–112)
Creatinine, Ser: 0.97 mg/dL (ref 0.50–1.10)
GFR calc Af Amer: 60 mL/min — ABNORMAL LOW (ref >90)
GFR calc non Af Amer: 51 mL/min — ABNORMAL LOW (ref >90)
Potassium: 3.5 mEq/L (ref 3.5–5.1)

## 2011-07-29 LAB — CBC
HCT: 39.4 % (ref 36.0–46.0)
MCHC: 31 g/dL (ref 30.0–36.0)
Platelets: 220 10*3/uL (ref 150–400)
RDW: 13.9 % (ref 11.5–15.5)
WBC: 5.6 10*3/uL (ref 4.0–10.5)

## 2011-07-29 LAB — GLUCOSE, CAPILLARY
Glucose-Capillary: 128 mg/dL — ABNORMAL HIGH (ref 70–99)
Glucose-Capillary: 117 mg/dL — ABNORMAL HIGH (ref 70–99)

## 2011-07-29 LAB — MAGNESIUM: Magnesium: 2.2 mg/dL (ref 1.5–2.5)

## 2011-07-30 LAB — CBC
MCH: 27.5 pg (ref 26.0–34.0)
MCHC: 31.1 g/dL (ref 30.0–36.0)
MCV: 88.4 fL (ref 78.0–100.0)
Platelets: 243 10*3/uL (ref 150–400)
RDW: 13.9 % (ref 11.5–15.5)

## 2011-07-30 LAB — GLUCOSE, CAPILLARY
Glucose-Capillary: 121 mg/dL — ABNORMAL HIGH (ref 70–99)
Glucose-Capillary: 137 mg/dL — ABNORMAL HIGH (ref 70–99)
Glucose-Capillary: 187 mg/dL — ABNORMAL HIGH (ref 70–99)
Glucose-Capillary: 193 mg/dL — ABNORMAL HIGH (ref 70–99)

## 2011-07-30 LAB — BASIC METABOLIC PANEL
Calcium: 9.1 mg/dL (ref 8.4–10.5)
Creatinine, Ser: 0.83 mg/dL (ref 0.50–1.10)
GFR calc Af Amer: 72 mL/min — ABNORMAL LOW (ref >90)
GFR calc non Af Amer: 62 mL/min — ABNORMAL LOW (ref >90)
Sodium: 141 mEq/L (ref 135–145)

## 2011-07-30 LAB — PROTIME-INR: Prothrombin Time: 26.3 seconds — ABNORMAL HIGH (ref 11.6–15.2)

## 2011-07-31 LAB — BASIC METABOLIC PANEL
CO2: 33 mEq/L — ABNORMAL HIGH (ref 19–32)
Calcium: 9 mg/dL (ref 8.4–10.5)
Creatinine, Ser: 0.77 mg/dL (ref 0.50–1.10)
GFR calc non Af Amer: 74 mL/min — ABNORMAL LOW (ref >90)

## 2011-07-31 LAB — CBC
HCT: 36.3 % (ref 36.0–46.0)
MCHC: 30.6 g/dL (ref 30.0–36.0)
Platelets: 214 10*3/uL (ref 150–400)
RDW: 13.9 % (ref 11.5–15.5)

## 2011-07-31 LAB — GLUCOSE, CAPILLARY

## 2011-07-31 LAB — PROTIME-INR: INR: 2.69 — ABNORMAL HIGH (ref 0.00–1.49)

## 2011-08-01 LAB — GLUCOSE, CAPILLARY: Glucose-Capillary: 163 mg/dL — ABNORMAL HIGH (ref 70–99)

## 2011-08-04 NOTE — Discharge Summary (Signed)
Toni Serrano, Toni Serrano            ACCOUNT NO.:  [id number]  MEDICAL RECORD NO.:  [id number]  LOCATION:                                 FACILITY:  PHYSICIAN:  Kela Millin, M.D.DATE OF BIRTH:  03/12/1923  DATE OF ADMISSION:  07/29/2011 DATE OF DISCHARGE:  07/31/2011                              DISCHARGE SUMMARY   DISCHARGE DIAGNOSES: 1. Generalized weakness, status post fall. 2. Failure to thrive, adult. 3. History of right cerebrovascular accident with left-sided deficits     in May 2010. 4. History of carotid endarterectomy in June 2010 and on chronic     Coumadin. 5. Hypothyroidism. 6. History of seizures. 7. History of Lewy body dementia. 8. History of diabetes - diet controlled. 9. Left forehead hematoma - status post fall. 10.Hypertension.  PROCEDURES AND STUDIES: 1. CT scan of the head on July 28, 2011 - no acute intracranial     abnormality.  Atrophy, chronic ischemic white matter disease, and     right MCA division encephalomalacia.  Evolving left forehead/scalp     hematoma.  Left forehead/scalp lesion again noted. 2. Chest x-ray - cardiomegaly, bibasilar scarring.  HISTORY:  The patient is an 77 year old white female with above-listed medical problems, admitted with weakness status post fall and family was requesting placement.  Please see the full H and P dictated on July 29, 2011 by Dr. Mikeal Hawthorne for the details of the admission history, physical exam, and laboratory data.  HOSPITAL COURSE: 1. Status post fall with generalized weakness - upon admission, the     patient had a CT scan of her brain and the results as stated above     with no acute intracranial findings.  Further workup included     urinalysis which was unremarkable, a TSH was done and it came back     within normal limits at 2.684.  She had a troponin done which came     back within normal limits on admission.  A chest x-ray did not show     any findings consistent with pneumonia.   PT/OT was consulted and     they saw the patient and the recommended a skilled nursing and she     has been placed and will be discharged to that facility at this     time. 2. Failure to thrive, adult, - as discussed above.  Workup for     underlying causes came back negative and the family indicated that     they had been taking care of her at home and somebody was always     with her, but they had gotten to where they felt they were not able     to provide as much care as she was needing.  As discussed above,     she was seen by Physical Therapy and skilled nursing recommended. 3. History of CVA and status post carotid endarterectomy - it was     noted that the patient was on chronic Coumadin and in the light of     her fall which she was admitted, I discussed the risks of Coumadin     with the patient's family and her daughter indicated that falls  had     not been much of a problem for her, and with that said she was     continued on the Coumadin.  She is to follow up with her outpatient     MDs/nursing home physician for further monitoring and management as     clinically appropriate. 4. History of seizures - the patient was maintained on her Lamictal     during this hospital stay and she did not have any seizures. 5. History of Lewy body dementia - the patient was maintained on her     outpatient medications. 6. Hypertension - the patient's blood pressures were noted to be     persistently elevated and she was started on Norvasc during this     hospital stay. 7. Her other chronic medical conditions remained stable during this     hospital stay and she was maintained on her outpatient medications     except as indicated above.  DISCHARGE MEDICATIONS: 1. Norvasc 5 mg p.o. daily. 2. Tylenol 500 mg two tablets q.6 hours p.r.n. 3. Actonel 35 mg p.o. q. weekly on Sundays. 4. Carbidopa/levodopa 25/100 mg half a tablet p.o. t.i.d. 5. Docusate 100 mg p.o. b.i.d. 6. Lamictal XR 100 mg  one p.o. daily. 7. Levothroid 88 mcg p.o. daily. 8. Namenda 10 mg p.o. at bedtime. 9. Razadyne 24 mg one p.o. daily. 10.Zocor 40 mg p.o. at bedtime. 11.Symbyax 3/25 mg p.o. daily. 12.Coumadin 5 mg p.o. daily or as directed per nursing home MD.  FOLLOWUP CARE: 1. Nursing home physician in 1-2 days. 2. The patient is to have a PT/INR done in a.m., August 01, 2011, and     further Coumadin dosing as per nursing home MD.  DISCHARGE CONDITION:  Improved/stable.     Kela Millin, M.D.     ACV/MEDQ  D:  07/31/2011  T:  07/31/2011  Job:  [id number]  cc:   Hazle Coca, MD  Electronically Signed by Donnalee Curry M.D. on 08/04/2011 09:13:18 PM

## 2011-08-10 NOTE — H&P (Signed)
Toni Serrano, Toni Serrano            ACCOUNT NO.:  [id number]  MEDICAL RECORD NO.:  [id number]  LOCATION:  5504                         FACILITY:  [facility]  PHYSICIAN:  Carlota Raspberry, MD         DATE OF BIRTH:  December 30, 1922  DATE OF ADMISSION:  07/28/2011 DATE OF DISCHARGE:                             HISTORY & PHYSICAL   PRIMARY CARE PHYSICIAN:  Ignacia Marvel, MD in [place].  CHIEF COMPLAINT:  Failure to thrive, weakness, falls, requesting placement.  HISTORY OF PRESENT ILLNESS:  This is an 77 year old female with a history of CVA in May 2010 with resultant carotid endarterectomy in June 2010, currently on Coumadin, Lewy body dementia, diabetes, not on insulin who presents as a transfer from [facility] with generalized weakness, garbled speech, and recent falls and is transferred to [facility] for evaluation and potential placement.  The patient does not provide any history, but her daughter is at the bedside and states that she had a stroke in May 2010 with residual left- sided deficits that was treated with a right-sided CEA and Coumadin for which she then moved in with her daughter to be a more structured environment.  She was doing okay, able to get around with her walker until a couple weeks ago when she had a fall and suffered a left-sided scalp hematoma and broke her left wrist and left middle finger and several bones in her left hand.  She was put into a left arm splint and the daughter reports that she has been having a lot of difficulty getting around at home using a walker since then.  Things have progressed over the past couple of weeks to the point that she is unable to walk and that she is also been acting differently with more slurred and garbled speech, increased confusion, and talking during the nights. She almost had a fall this morning, but was caught.  She was brought to [facility] where initial vital signs were 98.2, 171/68, pulse 72,  respirations 16, and reportedly 89-90% on unclear amount of oxygen.  However, I am not sure that this was fully substantiated.  Her evaluation at [facility] was fairly unremarkable.  She had a negative UA, CBC.  Her CT head showed no acute process and showed persistent left forehead scalp hematoma.  Chest x-ray showed cardiomegaly with bibasilar scarring.  Troponin was negative. Chemistry panel showed minimal hypokalemia and an elevated creatinine of 1.4.  She was transferred to [facility] for further evaluation and potential placement as her family is requesting.  REVIEW OF SYSTEMS:  As above.  Otherwise, her daughter states she has had no other cardiopulmonary symptoms.  No shortness of breath, chest pain.  No GI symptoms of nausea, vomiting, diarrhea, or abdominal pain. She eats well and has an appetite and is requesting something to eat right now.  Otherwise, no other complaints and ROS otherwise negative.  PAST MEDICAL HISTORY:  CVA on the right with left-sided deficits in May 2010 followed by right carotid endarterectomy in June 2010 and maintained on Coumadin.  Lewy body dementia.  Diabetes, not on insulin.  Per e-chart, hypothyroidism and seizures.  Daughter denies any  heart issues (MIs, CHF) and daughter denies any cancers.  MEDICATIONS:  Reconciled with the daughter who is a reliable historian, includes: 1. Acetaminophen 1000 mg q.6 p.r.n. pain. 2. Carbidopa/levodopa 25/100, 0.5 tablet by mouth t.i.d. 3. Docusate 100 mg b.i.d. 4. Galantamine 24 mg with breakfast. 5. Lamotrigine 100 mg daily. 6. Levothyroxine 88 mcg daily. 7. Memantine 20 mg at bedtime. 8. Olanzapine/fluoxetine 3/25 by mouth every evening. 9. Risedronate 35 mg by mouth every 7 days. 10.Simvastatin 40 mg at bedtime. 11.Warfarin either 5 for 6 mg daily.  Allergies listed are to PENICILLIN, which causes some rash.  SOCIAL HISTORY:  She has been living at home with her daughter for  the past 2 years since her stroke, but has another son.  She was a previous heavy smoker 1.5 pack per day for a couple decades, but has not smoked in 20 years.  She does not drink any alcohol or do any drugs.  She was previously still able to ambulate with a walker until a couple of weeks ago.  FAMILY HISTORY:  Her mother lived until she was 30, otherwise noncontributory.  PHYSICAL EXAMINATION:  VITAL SIGNS:  Blood pressure 150/78, pulse 68, temperature 98.1, respirations 20, 97% on 2 liters nasal cannula. GENERAL:  She is an very elderly thin short female in the hospital bed with her family at the bedside.  She does not appear in any distress and is quite hard of hearing, so defers conversation to her daughter.  She appears well. HEENT:  Her pupils are round and equal.  Her extraocular muscles are intact.  Her sclerae are clear.  Her mouth is very dry appearing, and her tongue is quite dry.  There are no gross lesions noted.  She has a sebaceous cyst above her left eye lid and laterally and superiorly to this is a 4-5 cm round oval hematoma.  There is also some bruising around her left eye. LUNGS:  With bibasilar pan-inspiratory crackles that clear as you go towards the apices.  There is no wheezes or rhonchi.  She has very poor air movement though, and this is mostly due to not following commands to breathe deeply. HEART:  Regular rate and rhythm with no murmurs or gallops appreciated. It is quite benign. ABDOMEN:  Soft, nontender, nondistended, and benign. EXTREMITIES:  Warm, well-perfused.  There is no bilateral lower extremity edema.  She has very thin and almost cachectic-appearing extremities.  There are some senile ecchymoses. NEUROLOGIC:  She is alert, able to answer questions appropriately but is not very conversant.  She is a bit hard of hearing.  She is able to follow commands.  She is able to lift her arms off of the bed and keep them up, and there is no clonus in her  upper extremities, and her strength is intact for proximal and distal muscle groups in her upper extremities.  Her bilateral lower extremities are also without gross defects.  She is able to lift up each of her legs off of the bed and keep them up.  Her hip extension is also intact.  She is able to bend her knees and kick out and press down the gas pedal bilaterally. Sensation is grossly intact throughout.  I do not appreciate any clonus or tremors.  LABORATORY WORK:  White blood cell count 7.1, hematocrit 38.2, platelets 255, INR is 2.4.  Chemistry panel, sodium 142, potassium 3.4, chloride 99, bicarb 34, BUN and creatinine are 28 and 1.4, glucose is 162. Troponin is negative  x1.  UA with moderate bilirubin, 15 ketones, 30 protein, negative leukoesterase and nitrite, rare bacteria.  Chest x-ray shows mild cardiomegaly and bibasilar scarring.  Her CT head noncontrast shows no acute process but with atrophy, chronic ischemic white matter disease and right MCA division encephalomalacia in the left forehead/scalp hematoma.  EKG is normal sinus rhythm at 75 beats per minute.  She has left axis with some left anterior fascicular block.  P-waves show some left atrial enlargement.  QRS are narrow at 82 milliseconds with slightly delayed R- wave progression in her precordials.  ST-T segment fairly unremarkable. T-waves are all appropriate.  Overall, this is a fairly unimpressive EKG, and there is no prior for comparison.  IMPRESSION:  This is an 77 year old female with a history of prior right cerebrovascular accident in May 2010 status post right carotid endarterectomy, Lewy body dementia, who presents with a fall resulting in left scalp hematoma and left arm injuries 2 weeks ago with resultant inability to use her walker, and progressive weakness, failure to thrive plus/minus delirium who is transferred to [facility] for evaluation of placement and medical management. 1. Weakness, failure  to thrive plus/minus delirium.  The differential     for this includes stepwise decline and progression of her advanced     age and underlying comorbidities in the setting of a recent     traumatic event versus dehydration given her creatinine of 1.4 up     from 0.8 two years ago with mild ketonuria as well.  She does not     appear to be having any acute intracranial process that I can tell.     Pain does not appear to be an issue either.  Frankly, nothing is     jumping out as a medical etiology as her chest x-ray, UA, EKG,     troponin, CT head are all unremarkable. Therefore, I will put her in for PT and OT consult, and a social work consult for potential placement.  We will give her IVF overnight maintenance to eliminate any possibility of hypovolemia and get orthostatics.  We will get a TSH.  Oral potassium repletion. 1. Metabolic alkalosis.  No clear etiology.  She is not on a diuretics     to suggest a contraction alkalosis.  There is no reported history     of lung disease, but she does have bibasilar scarring and this may     likely be a chronic compensation for chronic respiratory acidosis,     which is concurrent with the fact that her bicarb was 31 two years     ago as well.  We will just continue to trend this for now as I do     not think this is the likely etiology of her presentation. 2. Status post cerebrovascular accident and carotid endarterectomy,     currently on Coumadin.  Her INR is therapeutic.  We will continue     it per pharmacy. 3. Acute kidney injury versus chronic kidney disease.  Her creatinine     baseline is unclear to me as the last one was 2 years ago was 0.8.     Given her age, 1.4 may be her baseline.  We will hydrate her     overnight and trend her BMET to see if she is hypovolemic. 4. Diabetes.  She was not previously on any insulin or any oral     hypoglycemic agent, so for now we will just trend CBG q.4 and  hold     off on any insulin for now.   Her current glucose at 162 is     reasonable given her age. 5. Lewy body dementia.  We will continue her home medications of     carbidopa/levodopa, galantamine, memantine. 6. Hypothyroidism.  Continue home thyroid medication. 7. History of seizures.  Continue home lamotrigine. 8. Fluid electrolytes and nutrition.  Heart healthy diabetic diet and     2 liters of normal saline overnight at maintenance rates. 9. Prophylaxis.  She is coumadinized.  We will give her Colace and     senna and Tylenol for pain. 10.IV access.  She has one peripheral IV. 11.Code status.  She is full code as I discussed with her daughter who     became tearful during the conversation of this. 12.Admitted to regular bed, [facility] 3.          ______________________________ Carlota Raspberry, MD     EB/MEDQ  D:  07/29/2011  T:  07/29/2011  Job:  [id number]  Electronically Signed by Carlota Raspberry MD on 08/10/2011 07:46:40 PM

## 2012-11-01 IMAGING — CR DG CHEST 2V
1 series · 1 of 1 positions shown · non-contrast
Comparison: 07/28/2009

CLINICAL DATA: Fall.  Unsteady gait.

CHEST - 2 VIEW

[view not recorded]
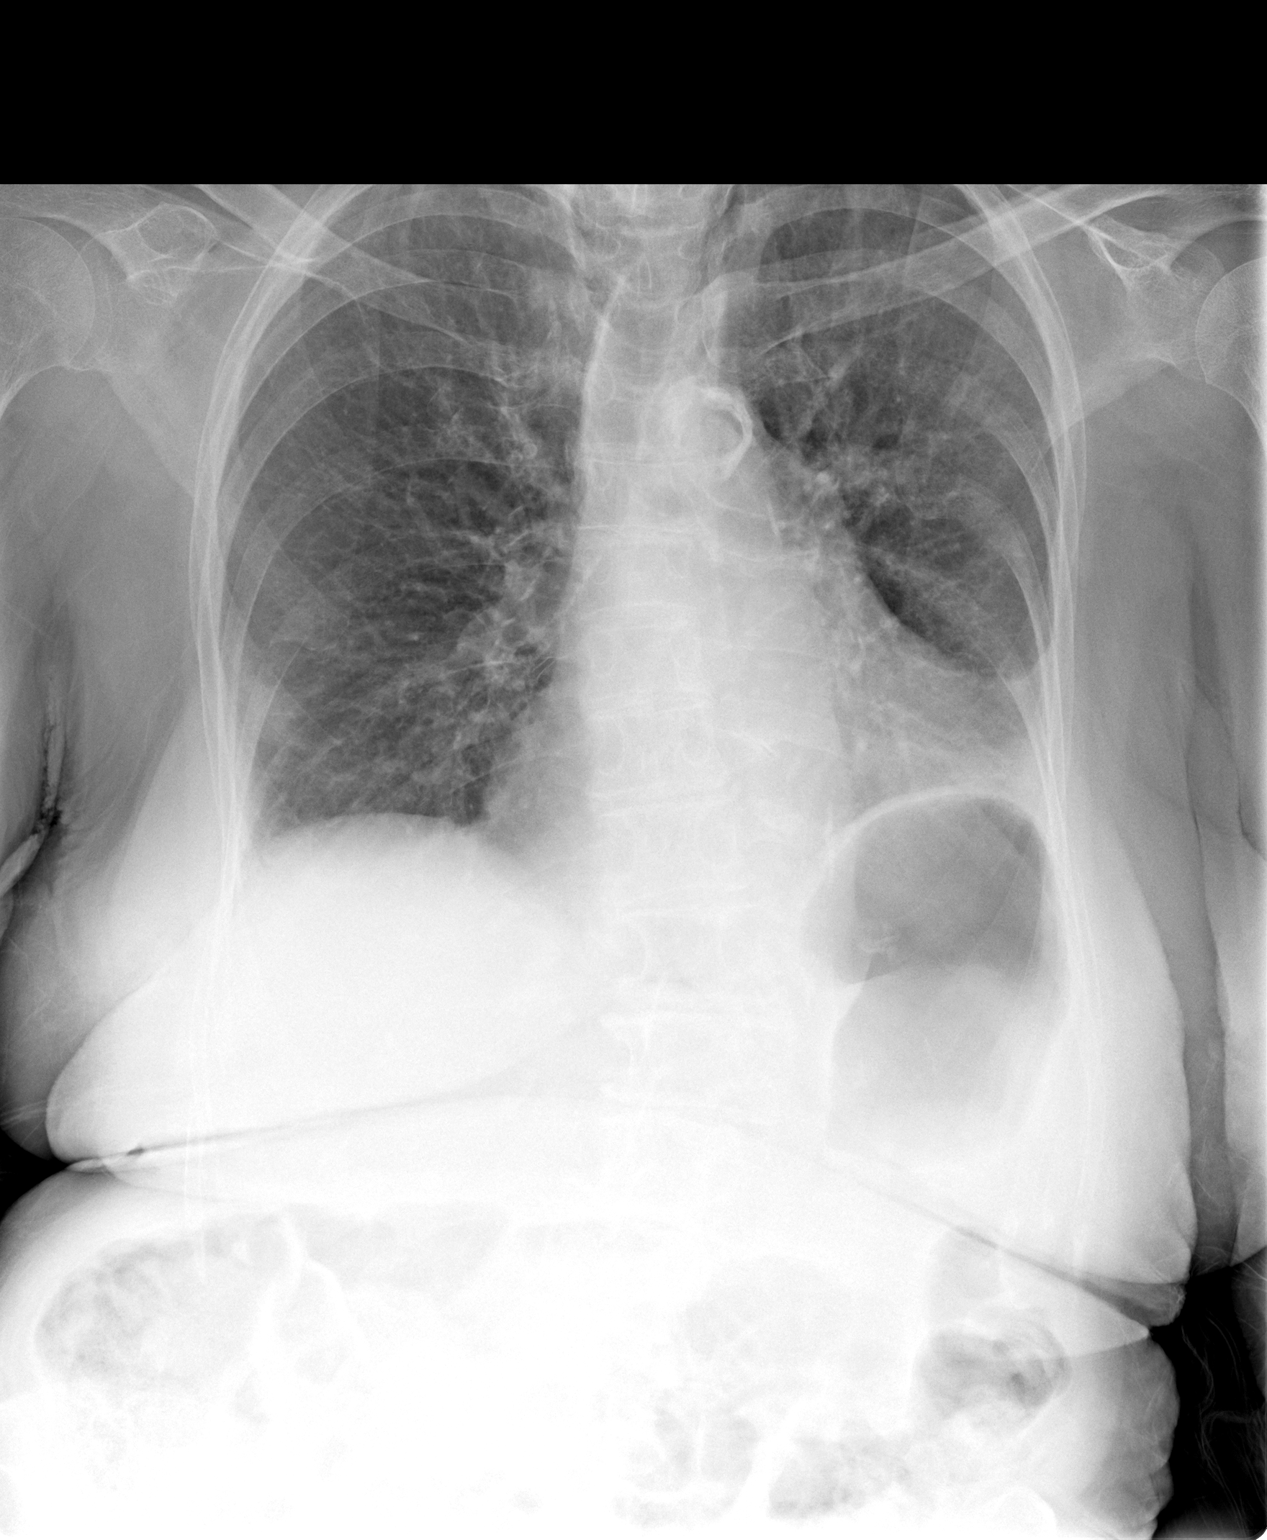

[1 of 1 positions shown; findings below may reference images not displayed]

FINDINGS: Mild cardiomegaly.  Probable scarring in the lingula.
Nodular density again seen in the right lung laterally is
unchanged, most compatible with scarring.  No effusions.  No acute
bony abnormality.
IMPRESSION: Cardiomegaly, bibasilar scarring.

## 2013-11-23 DEATH — deceased
